# Patient Record
Sex: Male | Born: 1970 | Race: Black or African American | Hispanic: No | Marital: Single | Smoking: Former smoker
Health system: Southern US, Community
[De-identification: ages and names within clinical notes are randomized; demographics above are authoritative.]

---

## 2012-07-20 ENCOUNTER — Encounter (HOSPITAL_COMMUNITY): Payer: Self-pay | Admitting: *Deleted

## 2012-07-20 ENCOUNTER — Emergency Department (HOSPITAL_COMMUNITY)
Admission: EM | Admit: 2012-07-20 | Discharge: 2012-07-20 | Disposition: A | Discharge status: Home / Self Care | Payer: BC Managed Care – PPO | Attending: Emergency Medicine | Admitting: Emergency Medicine

## 2012-07-20 ENCOUNTER — Emergency Department (HOSPITAL_COMMUNITY): Payer: BC Managed Care – PPO

## 2012-07-20 DIAGNOSIS — M25462 Effusion, left knee: Secondary | ICD-10-CM

## 2012-07-20 DIAGNOSIS — M25469 Effusion, unspecified knee: Secondary | ICD-10-CM | POA: Insufficient documentation

## 2012-07-20 MED ORDER — HYDROCODONE-ACETAMINOPHEN 5-325 MG PO TABS: 2.0000 | ORAL_TABLET | ORAL | Status: DC | PRN

## 2012-07-20 NOTE — ED Provider Notes (Addendum)
   History    CSN: [id number] Arrival date & time 07/20/12  4098  First MD Initiated Contact with Patient 07/20/12 1928     Chief Complaint  Patient presents with  . Leg Swelling   (Consider location/radiation/quality/duration/timing/severity/associated sxs/prior Treatment) HPI Comments: The patient presents with complaints of pain and swelling in the left knee that started two days ago.  He denies any specific injury or trauma, but admits to working two jobs and being on his feet a lot.  He denies any fevers or chills.  No vomiting or diarrhea.  The history is provided by the patient.   History reviewed. No pertinent past medical history. History reviewed. No pertinent past surgical history. History reviewed. No pertinent family history. History  Substance Use Topics  . Smoking status: Not on file  . Smokeless tobacco: Not on file  . Alcohol Use: No    Review of Systems  All other systems reviewed and are negative.    Allergies  Review of patient's allergies indicates no known allergies.  Home Medications  No current outpatient prescriptions on file. BP 143/88  Pulse 104  Temp(Src) 99.7 F (37.6 C) (Oral)  Resp 22  Wt 214 lb 2 oz (97.126 kg)  SpO2 100% Physical Exam  Nursing note and vitals reviewed. Constitutional: He is oriented to person, place, and time. He appears well-developed and well-nourished. No distress.  HENT:  Head: Normocephalic and atraumatic.  Neck: Normal range of motion. Neck supple.  Musculoskeletal: He exhibits no edema.  The left knee is noted to have an effusion present.  There is no redness but there is some warmth.  He has good range of motion without crepitus.  Stable ap and laterally.  Negative ap drawer test.  Neurological: He is alert and oriented to person, place, and time.  Skin: Skin is warm and dry. He is not diaphoretic.    ED Course  Procedures (including critical care time) Labs Reviewed - No data to display No results  found. No diagnosis found.  MDM  Xrays show a small effusion, likely related to overuse / inflammation.  Will treat with nsaids, pain meds, and follow up with ortho if not improving.  There is no fever and I doubt a septic knee.    Geoffery Lyons, MD 07/20/12 2050  Geoffery Lyons, MD 07/31/12 1209

## 2012-07-20 NOTE — ED Notes (Signed)
Pt reports swelling to left leg, denies injury. Reports swelling started on Sunday.

## 2012-08-09 ENCOUNTER — Encounter: Payer: Self-pay | Admitting: Internal Medicine

## 2012-08-09 ENCOUNTER — Ambulatory Visit (INDEPENDENT_AMBULATORY_CARE_PROVIDER_SITE_OTHER): Payer: BC Managed Care – PPO | Admitting: Internal Medicine

## 2012-08-09 VITALS — BP 144/80 | HR 96 | Temp 98.7°F | Resp 20 | Ht 65.5 in | Wt 220.0 lb

## 2012-08-09 DIAGNOSIS — G4733 Obstructive sleep apnea (adult) (pediatric): Secondary | ICD-10-CM

## 2012-08-09 DIAGNOSIS — E669 Obesity, unspecified: Secondary | ICD-10-CM

## 2012-08-09 DIAGNOSIS — Z0000 Encounter for general adult medical examination without abnormal findings: Secondary | ICD-10-CM

## 2012-08-09 LAB — CBC WITH DIFFERENTIAL/PLATELET
Basophils Relative: 0.5 % (ref 0.0–3.0)
Eosinophils Absolute: 0.1 10*3/uL (ref 0.0–0.7)
Eosinophils Relative: 0.9 % (ref 0.0–5.0)
HCT: 40.7 % (ref 39.0–52.0)
Lymphs Abs: 2.7 10*3/uL (ref 0.7–4.0)
MCHC: 32 g/dL (ref 30.0–36.0)
MCV: 81 fl (ref 78.0–100.0)
Monocytes Absolute: 0.8 10*3/uL (ref 0.1–1.0)
Neutrophils Relative %: 63.2 % (ref 43.0–77.0)
Platelets: 240 10*3/uL (ref 150.0–400.0)
RBC: 5.02 Mil/uL (ref 4.22–5.81)
WBC: 10 10*3/uL (ref 4.5–10.5)

## 2012-08-09 LAB — COMPREHENSIVE METABOLIC PANEL
AST: 23 U/L (ref 0–37)
Albumin: 3.5 g/dL (ref 3.5–5.2)
Alkaline Phosphatase: 65 U/L (ref 39–117)
Potassium: 3.7 mEq/L (ref 3.5–5.1)
Sodium: 140 mEq/L (ref 135–145)
Total Bilirubin: 0.3 mg/dL (ref 0.3–1.2)
Total Protein: 7 g/dL (ref 6.0–8.3)

## 2012-08-09 LAB — LIPID PANEL
HDL: 48.4 mg/dL (ref >39.00)
LDL Cholesterol: 104 mg/dL — ABNORMAL HIGH (ref 0–99)
VLDL: 15.8 mg/dL (ref 0.0–40.0)

## 2012-08-09 NOTE — Progress Notes (Signed)
Subjective:    Patient ID: Sean Kaiser, male    DOB: [birth year], 42 y.o.   MRN: [id number]  HPI   42 year old patient who is seen today for a preventive health examination and to establish with our practice. He was recently seen by orthopedics to 2 left knee pain and effusion. He apparently had a diagnostic arthrocentesis that was negative for gout. His knee is much improved. He has a history of OSA and uses CPAP sporadically. He has a history of exogenous obesity.  Social history patient has been in [place] resident since 1989 morning and rocking him. Served 8 years and remains. 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 No pertinent past medical history.  History   Social History  . Marital Status: Single    Spouse Name: N/A    Number of Children: N/A  . Years of Education: N/A   Occupational History  . Not on file.   Social History Main Topics  . Smoking status: Former Games developer  . Smokeless tobacco: Never Used  . Alcohol Use: No  . Drug Use: No  . Sexually Active: Not on file   Other Topics Concern  . Not on file   Social History Narrative  . No narrative on file    History reviewed. No pertinent past surgical history.  No family history on file.  No Known Allergies  Current Outpatient Prescriptions on File Prior to Visit  Medication Sig Dispense Refill  . HYDROcodone-acetaminophen (NORCO) 5-325 MG per tablet Take 2 tablets by mouth every 4 (four) hours as needed for pain.  20 tablet  0   No current facility-administered medications on file prior to visit.    BP 144/80  Pulse 96  Temp(Src) 98.7 F (37.1 C) (Oral)  Resp 20  Ht 5' 5.5" (1.664 m)  Wt 220 lb (99.791 kg)  BMI 36.04 kg/m2  SpO2 97%        Review of Systems  Constitutional: Negative  for fever, chills, activity change, appetite change and fatigue.  HENT: Negative for hearing loss, ear pain, congestion, rhinorrhea, sneezing, mouth sores, trouble swallowing, neck pain, neck stiffness, dental problem, voice change, sinus pressure and tinnitus.   Eyes: Negative for photophobia, pain, redness and visual disturbance.  Respiratory: Negative for apnea, cough, choking, chest tightness, shortness of breath and wheezing.   Cardiovascular: Negative for chest pain, palpitations and leg swelling.  Gastrointestinal: Negative for nausea, vomiting, abdominal pain, diarrhea, constipation, blood in stool, abdominal distention, anal bleeding and rectal pain.  Genitourinary: Negative for dysuria, urgency, frequency, hematuria, flank pain, decreased urine volume, discharge, penile swelling, scrotal swelling, difficulty urinating, genital sores and testicular pain.  Musculoskeletal: Positive for arthralgias. Negative for myalgias, back pain, joint swelling and gait problem.  Skin: Negative for color change, rash and wound.  Neurological: Negative for dizziness, tremors, seizures, syncope, facial asymmetry, speech difficulty, weakness, light-headedness, numbness and headaches.  Hematological: Negative for adenopathy. Does not bruise/bleed easily.  Psychiatric/Behavioral: Negative for suicidal ideas, hallucinations, behavioral problems, confusion, sleep disturbance, self-injury, dysphoric mood, decreased concentration and agitation. The patient is not nervous/anxious.        Objective:   Physical Exam  Constitutional: He appears well-developed and well-nourished.  HENT:  Head: Normocephalic and atraumatic.  Right Ear: External ear normal.  Left Ear: External ear normal.  Nose: Nose normal.  Mouth/Throat: Oropharynx is clear and moist.  Low hanging soft palate with pharyngeal crowding Upper dentures in place  Eyes: Conjunctivae and EOM are normal. Pupils are equal, round, and reactive to light.  No scleral icterus.  Neck: Normal range of motion. Neck supple. No JVD present. No thyromegaly present.  Cardiovascular: Regular rhythm, normal heart sounds and intact distal pulses.  Exam reveals no gallop and no friction rub.   No murmur heard. Pulmonary/Chest: Effort normal and breath sounds normal. He exhibits no tenderness.  Abdominal: Soft. Bowel sounds are normal. He exhibits no distension and no mass. There is no tenderness.  Genitourinary: Penis normal.  Musculoskeletal: Normal range of motion. He exhibits no edema and no tenderness.  Lymphadenopathy:    He has no cervical adenopathy.  Neurological: He is alert. He has normal reflexes. No cranial nerve deficit. Coordination normal.  Skin: Skin is warm and dry. No rash noted.  Psychiatric: He has a normal mood and affect. His behavior is normal.          Assessment & Plan:  Preventive health exam  resolving left knee effusion OSA Exogenous obesity weight loss encouraged Rule out hypertension home blood pressure readings encouraged. Recheck 6 months  Laboratory update

## 2012-08-09 NOTE — Patient Instructions (Signed)

## 2013-02-07 ENCOUNTER — Ambulatory Visit (INDEPENDENT_AMBULATORY_CARE_PROVIDER_SITE_OTHER): Payer: BC Managed Care – PPO | Admitting: Internal Medicine

## 2013-02-07 ENCOUNTER — Encounter: Payer: Self-pay | Admitting: Internal Medicine

## 2013-02-07 VITALS — BP 146/90 | HR 102 | Temp 99.0°F | Resp 20 | Ht 65.5 in | Wt 228.0 lb

## 2013-02-07 DIAGNOSIS — G4733 Obstructive sleep apnea (adult) (pediatric): Secondary | ICD-10-CM

## 2013-02-07 DIAGNOSIS — Z9989 Dependence on other enabling machines and devices: Principal | ICD-10-CM

## 2013-02-07 NOTE — Patient Instructions (Signed)
Limit your sodium (Salt) intake    It is important that you exercise regularly, at least 20 minutes 3 to 4 times per week.  If you develop chest pain or shortness of breath seek  medical attention.  You need to lose weight.  Consider a lower calorie diet and regular exercise.  DASH Diet The DASH diet stands for "Dietary Approaches to Stop Hypertension." It is a healthy eating plan that has been shown to reduce high blood pressure (hypertension) in as little as 14 days, while also possibly providing other significant health benefits. These other health benefits include reducing the risk of breast cancer after menopause and reducing the risk of type 2 diabetes, heart disease, colon cancer, and stroke. Health benefits also include weight loss and slowing kidney failure in patients with chronic kidney disease.  DIET GUIDELINES  Limit salt (sodium). Your diet should contain less than 1500 mg of sodium daily.  Limit refined or processed carbohydrates. Your diet should include mostly whole grains. Desserts and added sugars should be used sparingly.  Include small amounts of heart-healthy fats. These types of fats include nuts, oils, and tub margarine. Limit saturated and trans fats. These fats have been shown to be harmful in the body. CHOOSING FOODS  The following food groups are based on a 2000 calorie diet. See your Registered Dietitian for individual calorie needs. Grains and Grain Products (6 to 8 servings daily)  Eat More Often: Whole-wheat bread, brown rice, whole-grain or wheat pasta, quinoa, popcorn without added fat or salt (air popped).  Eat Less Often: White bread, white pasta, white rice, cornbread. Vegetables (4 to 5 servings daily)  Eat More Often: Fresh, frozen, and canned vegetables. Vegetables may be raw, steamed, roasted, or grilled with a minimal amount of fat.  Eat Less Often/Avoid: Creamed or fried vegetables. Vegetables in a cheese sauce. Fruit (4 to 5 servings  daily)  Eat More Often: All fresh, canned (in natural juice), or frozen fruits. Dried fruits without added sugar. One hundred percent fruit juice ( cup [237 mL] daily).  Eat Less Often: Dried fruits with added sugar. Canned fruit in light or heavy syrup. Lean Meats, Fish, and Poultry (2 servings or less daily. One serving is 3 to 4 oz [85-114 g]).  Eat More Often: Ninety percent or leaner ground beef, tenderloin, sirloin. Round cuts of beef, chicken breast, turkey breast. All fish. Grill, bake, or broil your meat. Nothing should be fried.  Eat Less Often/Avoid: Fatty cuts of meat, turkey, or chicken leg, thigh, or wing. Fried cuts of meat or fish. Dairy (2 to 3 servings)  Eat More Often: Low-fat or fat-free milk, low-fat plain or light yogurt, reduced-fat or part-skim cheese.  Eat Less Often/Avoid: Milk (whole, 2%).Whole milk yogurt. Full-fat cheeses. Nuts, Seeds, and Legumes (4 to 5 servings per week)  Eat More Often: All without added salt.  Eat Less Often/Avoid: Salted nuts and seeds, canned beans with added salt. Fats and Sweets (limited)  Eat More Often: Vegetable oils, tub margarines without trans fats, sugar-free gelatin. Mayonnaise and salad dressings.  Eat Less Often/Avoid: Coconut oils, palm oils, butter, stick margarine, cream, half and half, cookies, candy, pie. FOR MORE INFORMATION The Dash Diet Eating Plan: www.dashdiet.org Document Released: 12/19/2010 Document Revised: 03/24/2011 Document Reviewed: 12/19/2010 [employer] Patient Information 2014 [employer].  
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
## 2013-02-07 NOTE — Progress Notes (Signed)
Pre-visit discussion using our clinic review tool. No additional management support is needed unless otherwise documented below in the visit note.  

## 2014-03-13 IMAGING — CR DG KNEE COMPLETE 4+V*L*
4 series · 4 of 4 positions shown · non-contrast
Comparison: None.

CLINICAL DATA: Leg swelling

LEFT KNEE - COMPLETE 4+ VIEW

[t knee ap left]
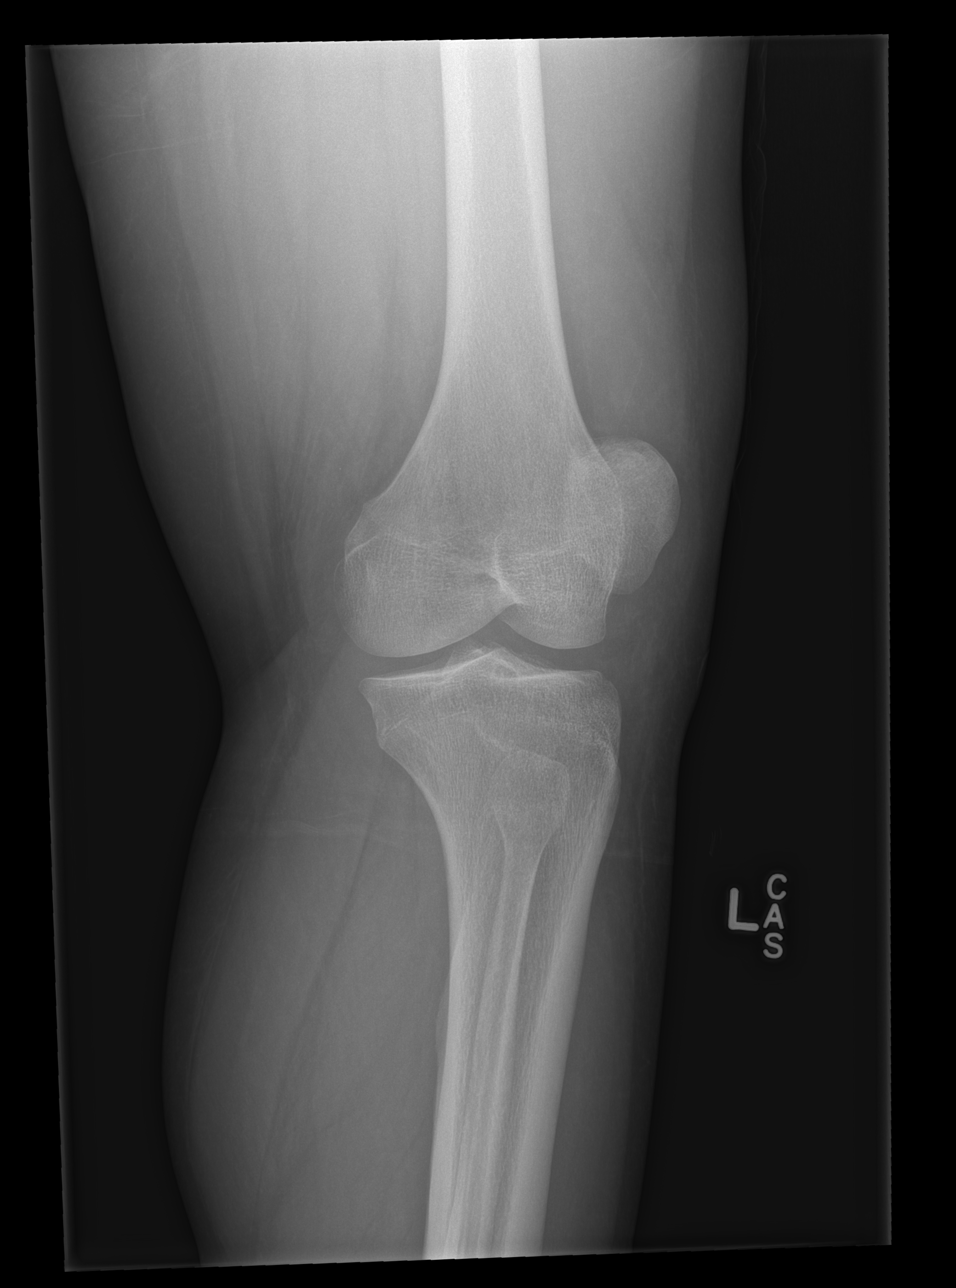

[t knee obl left (1 of 2)]
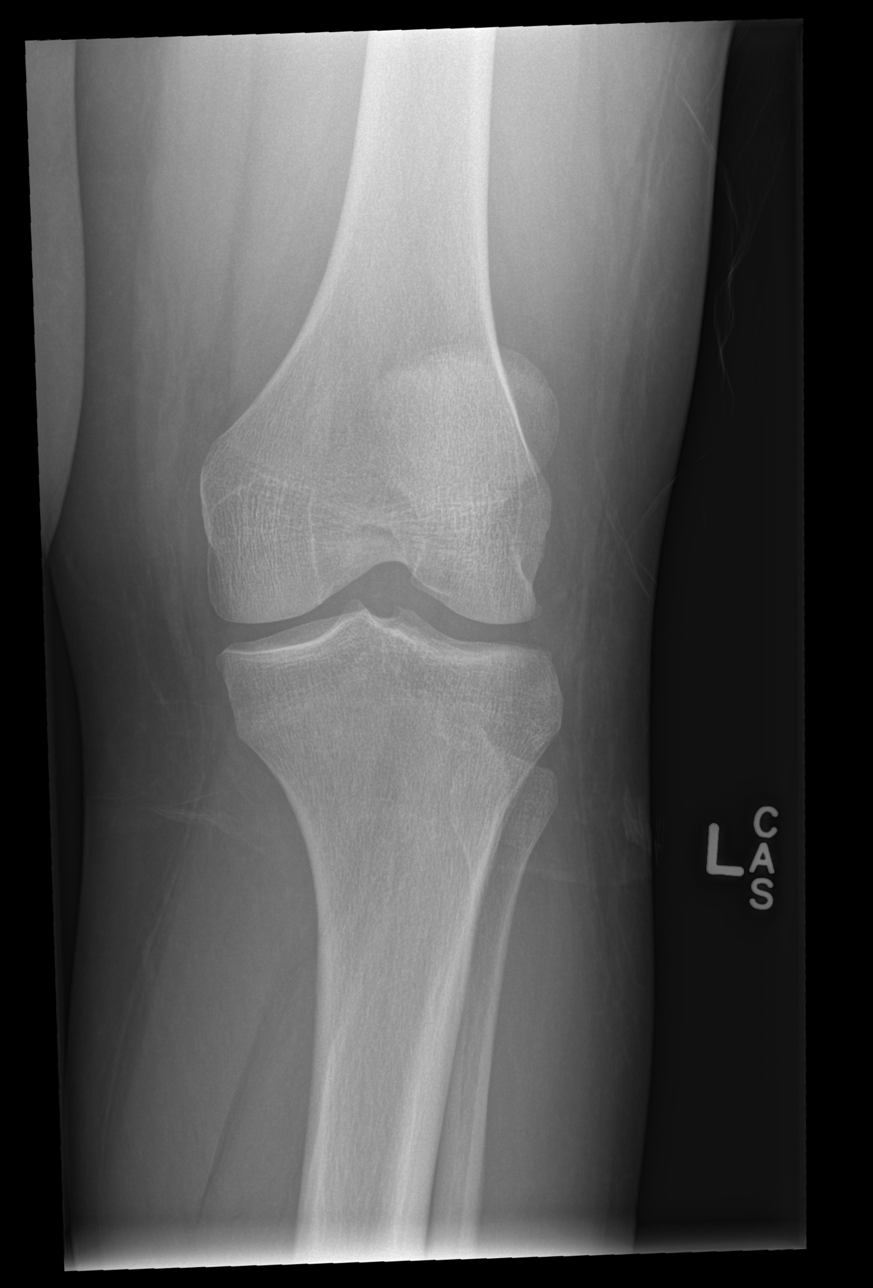

[t knee obl left (2 of 2)]
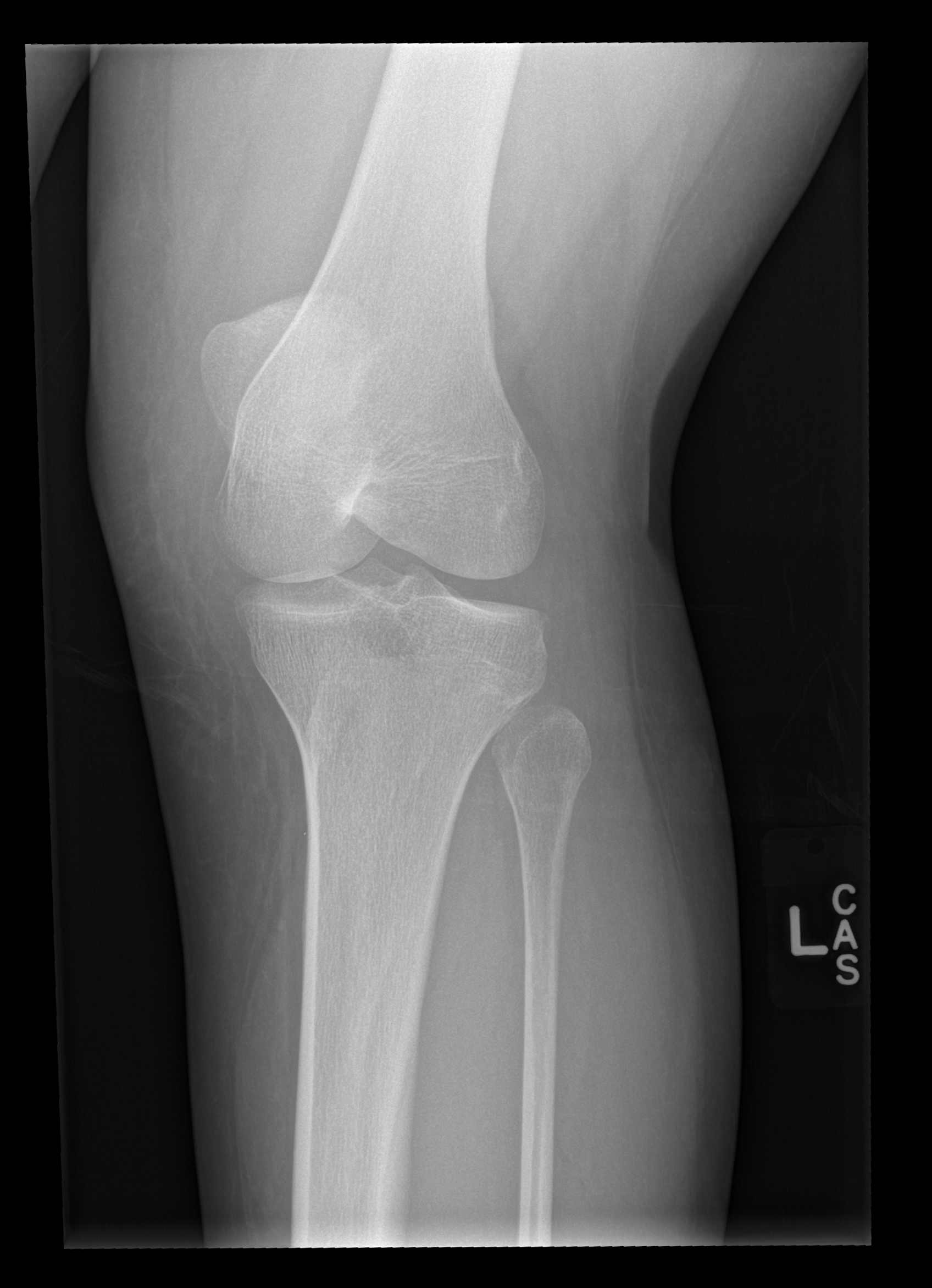

[t knee lat left]
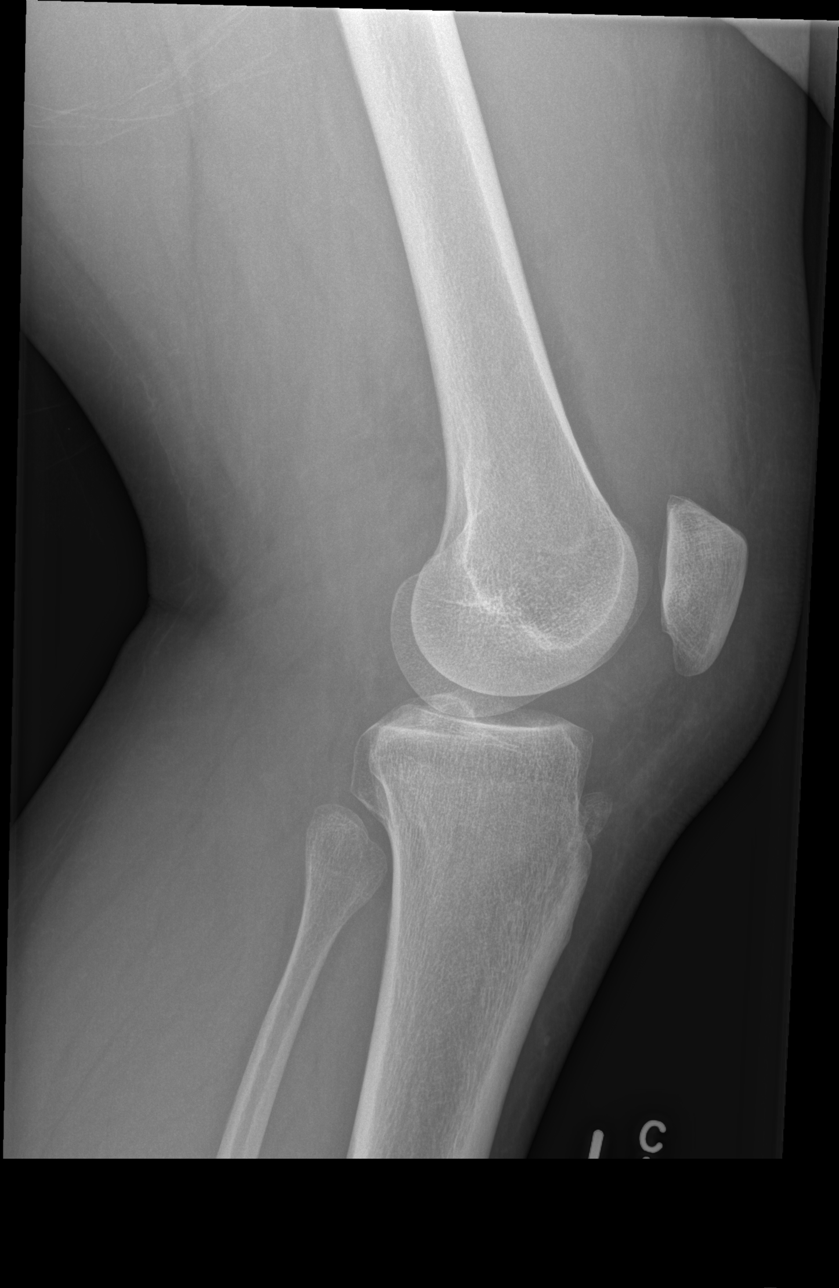

[4 of 4 positions shown; findings below may reference images not displayed]

FINDINGS: Small joint effusion.

No fracture or subluxation identified.

No radio-opaque foreign body or soft tissue calcification.
IMPRESSION: 1. Small joint effusion.

## 2016-05-26 ENCOUNTER — Emergency Department (HOSPITAL_COMMUNITY)
Admission: EM | Admit: 2016-05-26 | Discharge: 2016-05-27 | Disposition: A | Discharge status: Home / Self Care | Payer: Commercial Managed Care - HMO | Attending: Emergency Medicine | Admitting: Emergency Medicine

## 2016-05-26 ENCOUNTER — Encounter (HOSPITAL_COMMUNITY): Payer: Self-pay | Admitting: Emergency Medicine

## 2016-05-26 ENCOUNTER — Emergency Department (HOSPITAL_COMMUNITY): Payer: Commercial Managed Care - HMO

## 2016-05-26 DIAGNOSIS — L03012 Cellulitis of left finger: Secondary | ICD-10-CM | POA: Insufficient documentation

## 2016-05-26 DIAGNOSIS — M7989 Other specified soft tissue disorders: Secondary | ICD-10-CM | POA: Diagnosis present

## 2016-05-26 DIAGNOSIS — Z87891 Personal history of nicotine dependence: Secondary | ICD-10-CM | POA: Diagnosis not present

## 2016-05-26 DIAGNOSIS — Z79899 Other long term (current) drug therapy: Secondary | ICD-10-CM | POA: Insufficient documentation

## 2016-05-26 NOTE — ED Triage Notes (Signed)
Patient c/o swelling to left hand x2 days. Denies injury. Movement to all fingers. Cap refill <3 seconds. Ambulatory to triage.

## 2016-05-27 MED ORDER — CEPHALEXIN 500 MG PO CAPS: 500.0000 mg | ORAL_CAPSULE | Freq: Four times a day (QID) | ORAL | 0 refills | Status: AC

## 2016-05-27 MED ORDER — LIDOCAINE HCL (PF) 1 % IJ SOLN
5.0000 mL | Freq: Once | INTRAMUSCULAR | Status: AC
  Administered 2016-05-27: 5 mL
  Filled 2016-05-27: qty 30

## 2016-05-27 MED ORDER — SULFAMETHOXAZOLE-TRIMETHOPRIM 800-160 MG PO TABS: 1.0000 | ORAL_TABLET | Freq: Two times a day (BID) | ORAL | 0 refills | Status: AC

## 2016-05-27 NOTE — ED Provider Notes (Signed)
[facility]-EMERGENCY DEPT Provider Note   CSN: [id number] Arrival date & time: 05/26/16  2116     History   Chief Complaint No chief complaint on file.   HPI Sean Kaiser is a 46 y.o. male.  HPI  Patient presents with 2 day history of left middle finger swelling. More prominent under the nail.He states that symptoms began suddenly. He is having pain and swelling which has gotten increasingly worse in the past 2 days. Describes the pain as throbbing. States that the pain is radiating to wrist and elbow. Has not taken anything to alleviate pain.He denies any injury, trauma, wound, bite to the area. He denies any previous history of these symptoms. Denies history of gout. Denies any numbness, fever, weakness, nausea or vomiting.  History reviewed. No pertinent past medical history.  Patient Active Problem List   Diagnosis Date Noted  . OSA (obstructive sleep apnea) 08/09/2012  . Obesity, unspecified 08/09/2012    History reviewed. No pertinent surgical history.     Home Medications    Prior to Admission medications   Medication Sig Start Date End Date Taking? Authorizing Provider  cephALEXin (KEFLEX) 500 MG capsule Take 1 capsule (500 mg total) by mouth 4 (four) times daily. 05/27/16 06/03/16  Garfield Coiner, Hillary BowHina, PA-C  Multiple Vitamin (MULTIVITAMIN) tablet Take 1 tablet by mouth daily.    [provider]  sulfamethoxazole-trimethoprim (BACTRIM DS,SEPTRA DS) 800-160 MG tablet Take 1 tablet by mouth 2 (two) times daily. 05/27/16 06/03/16  Dietrich PatesKhatri, Reighn Kaplan, PA-C    Family History History reviewed. No pertinent family history.  Social History Social History  Substance Use Topics  . Smoking status: Former Games developermoker  . Smokeless tobacco: Never Used  . Alcohol use No     Allergies   Patient has no known allergies.   Review of Systems Review of Systems  Constitutional: Negative for chills and fever.  Respiratory: Negative for shortness of breath.   Cardiovascular: Negative  for chest pain.  Gastrointestinal: Negative for nausea and vomiting.  Musculoskeletal: Positive for joint swelling.  Skin: Positive for color change. Negative for wound.     Physical Exam Updated Vital Signs BP (!) 175/118 (BP Location: Right Wrist)   Pulse 95   Temp 99.8 F (37.7 C) (Oral)   Ht 5\' 6"  (1.676 m)   SpO2 96%   Physical Exam  Constitutional: He appears well-developed and well-nourished. No distress.  HENT:  Head: Normocephalic and atraumatic.  Eyes: Conjunctivae and EOM are normal. No scleral icterus.  Neck: Normal range of motion.  Pulmonary/Chest: Effort normal. No respiratory distress.  Musculoskeletal: He exhibits edema and tenderness.  Neurological: He is alert.  Skin: No rash noted. He is not diaphoretic.  Diffuse swelling of the middle finger of the left hand. There is a paronychia present. Area appears to be neurovascularly intact. Full active and passive range of motion of finger with the exception of DIP joint.  Psychiatric: He has a normal mood and affect.  Nursing note and vitals reviewed.    ED Treatments / Results  Labs (all labs ordered are listed, but only abnormal results are displayed) Labs Reviewed - No data to display  EKG  EKG Interpretation None       Radiology Dg Hand Complete Left  Result Date: 05/27/2016 CLINICAL DATA:  Middle finger swelling and pain, no known injury. EXAM: LEFT HAND - COMPLETE 3+ VIEW COMPARISON:  None. FINDINGS: Diffuse soft tissue edema of the third and possibly second digits. Probable dorsal soft tissue edema over  the metacarpals. No soft tissue air or radiopaque foreign body. No fracture, dislocation, osseous erosion or periosteal reaction. There is joint space narrowing or subchondral cystic change in osteophytes at the thumb metacarpal phalangeal joint consistent with osteoarthritis. Incidental note of osseous lunotriquetral coalition. IMPRESSION: Diffuse soft tissue edema of the third digit, with possible  soft tissue edema of the second digit and dorsum of the hand. This may be secondary to infection or inflammatory arthropathy, such as psoriatic arthritis. No acute osseous abnormality, osseous erosions or bony destructive change. 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 Electronically Signed   By: Rubye Oaks M.D.   On: 05/27/2016 00:19    Procedures .Marland KitchenIncision and Drainage Date/Time: 05/27/2016 1:25 AM Performed by: Dietrich Pates Authorized by: Dietrich Pates   Consent:    Consent obtained:  Verbal   Consent given by:  Patient   Risks discussed:  Bleeding, incomplete drainage and infection Location:    Indications for incision and drainage: paronychia.   Location:  Upper extremity   Upper extremity location:  Finger   Finger location:  L long finger Pre-procedure details:    Skin preparation:  Betadine Anesthesia (see MAR for exact dosages):    Anesthesia method:  Nerve block   Block needle gauge:  25 G   Block anesthetic:  Lidocaine 1% w/o epi Procedure details:    Incision types:  Stab incision   Scalpel blade:  11   Drainage:  Purulent   Drainage amount:  Moderate   Wound treatment:  Wound left open   Packing materials:  None Post-procedure details:    Patient tolerance of procedure:  Tolerated well, no immediate complications   (including critical care time)  Medications Ordered in ED Medications  lidocaine (PF) (XYLOCAINE) 1 % injection 5 mL (5 mLs Infiltration Given 05/27/16 0058)     Initial Impression / Assessment and Plan / ED Course  I have reviewed the triage vital signs and the nursing notes.  Pertinent labs & imaging results that were available during my care of the patient were reviewed by me and considered in my medical decision making (see chart for details).     Patient's symptoms concerning for paronychia versus other inflammatory joint condition. X-ray showed diffuse soft tissue edema at the site. Area appears neurovascularly  intact. Full active and passive range of motion of the finger. Patient is afebrile with no history of fever and nontoxic appearing. Physical exam revealed paronychia. This was incised and drained successfully. Will be given Keflex and Bactrim to be taken to prevent further infection. Return precautions given.  Patient discussed with and seen by Dr. Adela Lank.  Final Clinical Impressions(s) / ED Diagnoses   Final diagnoses:  Paronychia of finger of left hand    New Prescriptions New Prescriptions   CEPHALEXIN (KEFLEX) 500 MG CAPSULE    Take 1 capsule (500 mg total) by mouth 4 (four) times daily.   SULFAMETHOXAZOLE-TRIMETHOPRIM (BACTRIM DS,SEPTRA DS) 800-160 MG TABLET    Take 1 tablet by mouth 2 (two) times daily.     Dietrich Pates, PA-C 05/27/16 0127    Melene Plan, DO 05/27/16 [phone]

## 2016-05-27 NOTE — Discharge Instructions (Signed)
Take Keflex and Bactrim as directed. Complete entire course of antibiotics as directed. Please reattach information regarding the condition. Take Tylenol or ibuprofen as needed for pain and discomfort. Return to ED for worsening pain, increased drainage, increased bleeding, fevers, numbness or weakness.
